# Patient Record
Sex: Female | Born: 1955 | Race: White | Hispanic: No | Marital: Married | Smoking: Former smoker
Health system: Southern US, Community
[De-identification: ages and names within clinical notes are randomized; demographics above are authoritative.]

## PROBLEM LIST (undated history)

## (undated) DIAGNOSIS — I10 Essential (primary) hypertension: Secondary | ICD-10-CM

## (undated) DIAGNOSIS — E039 Hypothyroidism, unspecified: Secondary | ICD-10-CM

## (undated) DIAGNOSIS — M1990 Unspecified osteoarthritis, unspecified site: Secondary | ICD-10-CM

## (undated) DIAGNOSIS — K219 Gastro-esophageal reflux disease without esophagitis: Secondary | ICD-10-CM

## (undated) HISTORY — DX: Hypothyroidism, unspecified: E03.9

## (undated) HISTORY — DX: Unspecified osteoarthritis, unspecified site: M19.90

## (undated) HISTORY — DX: Gastro-esophageal reflux disease without esophagitis: K21.9

## (undated) HISTORY — DX: Essential (primary) hypertension: I10

## (undated) HISTORY — PX: COLONOSCOPY: SHX174

---

## 1991-04-22 HISTORY — PX: TUBAL LIGATION: SHX77

## 2000-06-19 ENCOUNTER — Encounter: Payer: Self-pay | Admitting: Family Medicine

## 2000-06-19 ENCOUNTER — Encounter: Admission: RE | Admit: 2000-06-19 | Discharge: 2000-06-19 | Payer: Self-pay | Admitting: Family Medicine

## 2001-07-02 ENCOUNTER — Encounter: Payer: Self-pay | Admitting: Family Medicine

## 2001-07-02 ENCOUNTER — Encounter: Admission: RE | Admit: 2001-07-02 | Discharge: 2001-07-02 | Payer: Self-pay | Admitting: Family Medicine

## 2003-01-12 ENCOUNTER — Encounter: Admission: RE | Admit: 2003-01-12 | Discharge: 2003-01-12 | Payer: Self-pay | Admitting: Family Medicine

## 2003-01-12 ENCOUNTER — Encounter: Payer: Self-pay | Admitting: Family Medicine

## 2004-02-07 ENCOUNTER — Encounter: Admission: RE | Admit: 2004-02-07 | Discharge: 2004-02-07 | Payer: Self-pay | Admitting: Family Medicine

## 2005-02-11 ENCOUNTER — Encounter: Admission: RE | Admit: 2005-02-11 | Discharge: 2005-02-11 | Payer: Self-pay | Admitting: Family Medicine

## 2006-02-24 ENCOUNTER — Encounter: Admission: RE | Admit: 2006-02-24 | Discharge: 2006-02-24 | Payer: Self-pay | Admitting: Family Medicine

## 2007-03-24 ENCOUNTER — Encounter: Admission: RE | Admit: 2007-03-24 | Discharge: 2007-03-24 | Payer: Self-pay | Admitting: Family Medicine

## 2008-04-06 ENCOUNTER — Encounter: Admission: RE | Admit: 2008-04-06 | Discharge: 2008-04-06 | Payer: Self-pay | Admitting: Family Medicine

## 2009-06-05 ENCOUNTER — Encounter: Admission: RE | Admit: 2009-06-05 | Discharge: 2009-06-05 | Payer: Self-pay | Admitting: Family Medicine

## 2011-12-31 ENCOUNTER — Other Ambulatory Visit: Payer: Self-pay | Admitting: Family Medicine

## 2011-12-31 DIAGNOSIS — Z1231 Encounter for screening mammogram for malignant neoplasm of breast: Secondary | ICD-10-CM

## 2012-01-22 ENCOUNTER — Ambulatory Visit
Admission: RE | Admit: 2012-01-22 | Discharge: 2012-01-22 | Disposition: A | Discharge status: Home / Self Care | Payer: PRIVATE HEALTH INSURANCE | Source: Ambulatory Visit | Attending: Family Medicine | Admitting: Family Medicine

## 2012-01-22 DIAGNOSIS — Z1231 Encounter for screening mammogram for malignant neoplasm of breast: Secondary | ICD-10-CM

## 2012-10-20 ENCOUNTER — Encounter: Payer: Self-pay | Admitting: Internal Medicine

## 2012-11-26 ENCOUNTER — Encounter: Payer: PRIVATE HEALTH INSURANCE | Admitting: Internal Medicine

## 2013-02-14 ENCOUNTER — Encounter: Payer: Self-pay | Admitting: Internal Medicine

## 2013-04-25 ENCOUNTER — Ambulatory Visit (AMBULATORY_SURGERY_CENTER): Payer: Self-pay | Admitting: *Deleted

## 2013-04-25 VITALS — Ht 64.0 in | Wt 210.4 lb

## 2013-04-25 DIAGNOSIS — Z1211 Encounter for screening for malignant neoplasm of colon: Secondary | ICD-10-CM

## 2013-04-25 MED ORDER — NA SULFATE-K SULFATE-MG SULF 17.5-3.13-1.6 GM/177ML PO SOLN: 1.0000 | Freq: Once | ORAL | Status: DC

## 2013-04-25 NOTE — Progress Notes (Signed)
Denies allergies to eggs or soy products. Denies complications with sedation or anesthesia. 

## 2013-05-09 ENCOUNTER — Ambulatory Visit (AMBULATORY_SURGERY_CENTER): Payer: PRIVATE HEALTH INSURANCE | Admitting: Internal Medicine

## 2013-05-09 ENCOUNTER — Encounter: Payer: Self-pay | Admitting: Internal Medicine

## 2013-05-09 VITALS — BP 169/87 | HR 62 | Temp 97.6°F | Resp 37 | Ht 64.0 in | Wt 210.0 lb

## 2013-05-09 DIAGNOSIS — K5730 Diverticulosis of large intestine without perforation or abscess without bleeding: Secondary | ICD-10-CM

## 2013-05-09 DIAGNOSIS — Z1211 Encounter for screening for malignant neoplasm of colon: Secondary | ICD-10-CM

## 2013-05-09 MED ORDER — SODIUM CHLORIDE 0.9 % IV SOLN: 500.0000 mL | INTRAVENOUS | Status: DC

## 2013-05-09 NOTE — Patient Instructions (Addendum)
No polyps seen today. You do have a condition called diverticulosis - common and not usually a problem. Please read the handout provided.  I will let you know pathology results and when to have another routine colonoscopy by mail.  I appreciate the opportunity to care for you. Iva Booparl E. Gessner, MD, [facility]   Per Dr. Leone PayorGessner handouts were given to your care partner on diverticulosis and a high fiber diet with liberal amount of fluids. You may resume your current medications today. Please call if any questions or concerns.    YOU HAD AN ENDOSCOPIC PROCEDURE TODAY AT THE [facility]: Refer to the procedure report that was given to you for any specific questions about what was found during the examination.  If the procedure report does not answer your questions, please call your gastroenterologist to clarify.  If you requested that your care partner not be given the details of your procedure findings, then the procedure report has been included in a sealed envelope for you to review at your convenience later.  YOU SHOULD EXPECT: Some feelings of bloating in the abdomen. Passage of more gas than usual.  Walking can help get rid of the air that was put into your GI tract during the procedure and reduce the bloating. If you had a lower endoscopy (such as a colonoscopy or flexible sigmoidoscopy) you may notice spotting of blood in your stool or on the toilet paper. If you underwent a bowel prep for your procedure, then you may not have a normal bowel movement for a few days.  DIET: Your first meal following the procedure should be a light meal and then it is ok to progress to your normal diet.  A half-sandwich or bowl of soup is an example of a good first meal.  Heavy or fried foods are harder to digest and may make you feel nauseous or bloated.  Likewise meals heavy in dairy and vegetables can cause extra gas to form and this can also increase the bloating.  Drink plenty of fluids but you should  avoid alcoholic beverages for 24 hours.  ACTIVITY: Your care partner should take you home directly after the procedure.  You should plan to take it easy, moving slowly for the rest of the day.  You can resume normal activity the day after the procedure however you should NOT DRIVE or use heavy machinery for 24 hours (because of the sedation medicines used during the test).    SYMPTOMS TO REPORT IMMEDIATELY: A gastroenterologist can be reached at any hour.  During normal business hours, 8:30 AM to 5:00 PM Monday through Friday, call [phone].  After hours and on weekends, please call the GI answering service at [phone] who will take a message and have the physician on call contact you.   Following lower endoscopy (colonoscopy or flexible sigmoidoscopy):  Excessive amounts of blood in the stool  Significant tenderness or worsening of abdominal pains  Swelling of the abdomen that is new, acute  Fever of 100F or higher   FOLLOW UP: If any biopsies were taken you will be contacted by phone or by letter within the next 1-3 weeks.  Call your gastroenterologist if you have not heard about the biopsies in 3 weeks.  Our staff will call the home number listed on your records the next business day following your procedure to check on you and address any questions or concerns that you may have at that time regarding the information given to you  following your procedure. This is a courtesy call and so if there is no answer at the home number and we have not heard from you through the emergency physician on call, we will assume that you have returned to your regular daily activities without incident.  SIGNATURES/CONFIDENTIALITY: You and/or your care partner have signed paperwork which will be entered into your electronic medical record.  These signatures attest to the fact that that the information above on your After Visit Summary has been reviewed and is understood.  Full responsibility of the  confidentiality of this discharge information lies with you and/or your care-partner.

## 2013-05-09 NOTE — Progress Notes (Signed)
No complaints noted in the recovery room. Maw   

## 2013-05-09 NOTE — Progress Notes (Signed)
A/ox3 pleased with MAC, report to Annette RN 

## 2013-05-09 NOTE — Op Note (Signed)
[facility] [address] [place], [id number]   COLONOSCOPY PROCEDURE REPORT  PATIENT: Virginia SaltsLong, [name]  MR#: [id number] BIRTHDATE: [birth year] , 57  yrs. old GENDER: Female ENDOSCOPIST: Iva Booparl E Gessner, MD, [facility] REFERRED XB:JYNWGNBY:Teresa Dareen PianoAnderson, F.N.P.-B.C. PROCEDURE DATE:  05/09/2013 PROCEDURE:   Colonoscopy, screening First Screening Colonoscopy - Avg.  risk and is 50 yrs.  old or older Yes.  Prior Negative Screening - Now for repeat screening. N/A  History of Adenoma - Now for follow-up colonoscopy & has been > or = to 3 yrs.  N/A  Polyps Removed Today? No.  Recommend repeat exam, <10 yrs? No. ASA CLASS:   Class II INDICATIONS:average risk screening and first colonoscopy. MEDICATIONS: propofol (Diprivan) 150mg  IV, MAC sedation, administered by CRNA, and These medications were titrated to patient response per physician's verbal order  DESCRIPTION OF PROCEDURE:   After the risks benefits and alternatives of the procedure were thoroughly explained, informed consent was obtained.  A digital rectal exam revealed no abnormalities of the rectum.   The LB PFC-H190 [id number]  endoscope was introduced through the anus and advanced to the cecum, which was identified by both the appendix and ileocecal valve. No adverse events experienced.   The quality of the prep was excellent using Suprep  The instrument was then slowly withdrawn as the colon was fully examined.   COLON FINDINGS: Mild diverticulosis was noted in the descending colon.   The colon mucosa was otherwise normal.   A right colon retroflexion was performed.  Retroflexed views revealed no abnormalities. The time to cecum=2 minutes 11 seconds.  Withdrawal time=8 minutes 12 seconds.  The scope was withdrawn and the procedure completed. COMPLICATIONS: There were no complications.  ENDOSCOPIC IMPRESSION: 1.   Mild diverticulosis was noted in the descending colon 2.   The colon mucosa was otherwise normal - excellent  prep - first colonoscopy  RECOMMENDATIONS: Repeat colonoscopy 10 years - 2025   eSigned:  Iva Booparl E Gessner, MD, [facility] 05/09/2013 11:06 AM   cc: Elizabeth Palaueresa Anderson, F.N.P.-B.C and The Patient

## 2013-05-10 ENCOUNTER — Telehealth: Payer: Self-pay | Admitting: *Deleted

## 2013-05-10 NOTE — Telephone Encounter (Signed)
  Follow up Call-  Call back number 05/09/2013  Post procedure Call Back phone  # [phone]  Permission to leave phone message Yes     Patient questions:  Do you have a fever, pain , or abdominal swelling? no Pain Score  0 *  Have you tolerated food without any problems? yes  Have you been able to return to your normal activities? yes  Do you have any questions about your discharge instructions: Diet   no Medications  no Follow up visit  no  Do you have questions or concerns about your Care? no  Actions: * If pain score is 4 or above: No action needed, pain <4.

## 2014-01-26 ENCOUNTER — Other Ambulatory Visit: Payer: Self-pay | Admitting: Nurse Practitioner

## 2014-01-26 DIAGNOSIS — Z1239 Encounter for other screening for malignant neoplasm of breast: Secondary | ICD-10-CM

## 2014-04-19 ENCOUNTER — Ambulatory Visit
Admission: RE | Admit: 2014-04-19 | Discharge: 2014-04-19 | Disposition: A | Discharge status: Home / Self Care | Payer: PRIVATE HEALTH INSURANCE | Source: Ambulatory Visit | Attending: Nurse Practitioner | Admitting: Nurse Practitioner

## 2014-04-19 DIAGNOSIS — Z1239 Encounter for other screening for malignant neoplasm of breast: Secondary | ICD-10-CM

## 2014-04-25 ENCOUNTER — Other Ambulatory Visit: Payer: Self-pay | Admitting: Nurse Practitioner

## 2014-04-25 DIAGNOSIS — R928 Other abnormal and inconclusive findings on diagnostic imaging of breast: Secondary | ICD-10-CM

## 2014-05-04 ENCOUNTER — Ambulatory Visit
Admission: RE | Admit: 2014-05-04 | Discharge: 2014-05-04 | Disposition: A | Discharge status: Home / Self Care | Payer: PRIVATE HEALTH INSURANCE | Source: Ambulatory Visit | Attending: Nurse Practitioner | Admitting: Nurse Practitioner

## 2014-05-04 DIAGNOSIS — R928 Other abnormal and inconclusive findings on diagnostic imaging of breast: Secondary | ICD-10-CM

## 2014-05-08 ENCOUNTER — Other Ambulatory Visit: Payer: PRIVATE HEALTH INSURANCE

## 2014-09-26 ENCOUNTER — Other Ambulatory Visit: Payer: Self-pay | Admitting: Nurse Practitioner

## 2014-09-26 DIAGNOSIS — N6310 Unspecified lump in the right breast, unspecified quadrant: Secondary | ICD-10-CM

## 2014-11-06 ENCOUNTER — Ambulatory Visit
Admission: RE | Admit: 2014-11-06 | Discharge: 2014-11-06 | Disposition: A | Discharge status: Home / Self Care | Payer: PRIVATE HEALTH INSURANCE | Source: Ambulatory Visit | Attending: Nurse Practitioner | Admitting: Nurse Practitioner

## 2014-11-06 DIAGNOSIS — N6310 Unspecified lump in the right breast, unspecified quadrant: Secondary | ICD-10-CM

## 2015-07-31 ENCOUNTER — Other Ambulatory Visit: Payer: Self-pay

## 2015-07-31 DIAGNOSIS — Z1231 Encounter for screening mammogram for malignant neoplasm of breast: Secondary | ICD-10-CM

## 2015-08-21 ENCOUNTER — Ambulatory Visit
Admission: RE | Admit: 2015-08-21 | Discharge: 2015-08-21 | Disposition: A | Discharge status: Home / Self Care | Payer: PRIVATE HEALTH INSURANCE | Source: Ambulatory Visit

## 2015-08-21 DIAGNOSIS — Z1231 Encounter for screening mammogram for malignant neoplasm of breast: Secondary | ICD-10-CM

## 2016-08-06 ENCOUNTER — Other Ambulatory Visit: Payer: Self-pay | Admitting: Nurse Practitioner

## 2016-08-06 DIAGNOSIS — Z1231 Encounter for screening mammogram for malignant neoplasm of breast: Secondary | ICD-10-CM

## 2016-09-01 ENCOUNTER — Ambulatory Visit
Admission: RE | Admit: 2016-09-01 | Discharge: 2016-09-01 | Disposition: A | Discharge status: Home / Self Care | Payer: PRIVATE HEALTH INSURANCE | Source: Ambulatory Visit | Attending: Nurse Practitioner | Admitting: Nurse Practitioner

## 2016-09-01 DIAGNOSIS — Z1231 Encounter for screening mammogram for malignant neoplasm of breast: Secondary | ICD-10-CM

## 2017-09-03 ENCOUNTER — Other Ambulatory Visit: Payer: Self-pay | Admitting: Nurse Practitioner

## 2017-09-03 DIAGNOSIS — Z1231 Encounter for screening mammogram for malignant neoplasm of breast: Secondary | ICD-10-CM

## 2017-12-08 ENCOUNTER — Ambulatory Visit: Payer: PRIVATE HEALTH INSURANCE

## 2018-03-17 ENCOUNTER — Ambulatory Visit
Admission: RE | Admit: 2018-03-17 | Discharge: 2018-03-17 | Disposition: A | Discharge status: Home / Self Care | Payer: PRIVATE HEALTH INSURANCE | Source: Ambulatory Visit | Attending: Nurse Practitioner | Admitting: Nurse Practitioner

## 2018-03-17 DIAGNOSIS — Z1231 Encounter for screening mammogram for malignant neoplasm of breast: Secondary | ICD-10-CM

## 2019-02-07 ENCOUNTER — Other Ambulatory Visit: Payer: Self-pay | Admitting: Nurse Practitioner

## 2019-02-07 DIAGNOSIS — Z1231 Encounter for screening mammogram for malignant neoplasm of breast: Secondary | ICD-10-CM

## 2019-03-24 ENCOUNTER — Ambulatory Visit: Payer: PRIVATE HEALTH INSURANCE

## 2019-05-13 ENCOUNTER — Ambulatory Visit: Payer: PRIVATE HEALTH INSURANCE

## 2019-09-26 ENCOUNTER — Other Ambulatory Visit: Payer: Self-pay

## 2019-09-26 ENCOUNTER — Ambulatory Visit
Admission: RE | Admit: 2019-09-26 | Discharge: 2019-09-26 | Disposition: A | Discharge status: Home / Self Care | Payer: Managed Care, Other (non HMO) | Source: Ambulatory Visit | Attending: Nurse Practitioner | Admitting: Nurse Practitioner

## 2019-09-26 DIAGNOSIS — Z1231 Encounter for screening mammogram for malignant neoplasm of breast: Secondary | ICD-10-CM

## 2019-09-28 ENCOUNTER — Ambulatory Visit: Payer: Managed Care, Other (non HMO)

## 2020-08-30 ENCOUNTER — Other Ambulatory Visit: Payer: Self-pay | Admitting: Nurse Practitioner

## 2020-08-30 DIAGNOSIS — Z1231 Encounter for screening mammogram for malignant neoplasm of breast: Secondary | ICD-10-CM

## 2020-11-21 ENCOUNTER — Other Ambulatory Visit: Payer: Self-pay

## 2020-11-21 ENCOUNTER — Ambulatory Visit
Admission: RE | Admit: 2020-11-21 | Discharge: 2020-11-21 | Disposition: A | Discharge status: Home / Self Care | Payer: BC Managed Care – PPO | Source: Ambulatory Visit | Attending: Nurse Practitioner | Admitting: Nurse Practitioner

## 2020-11-21 DIAGNOSIS — Z1231 Encounter for screening mammogram for malignant neoplasm of breast: Secondary | ICD-10-CM

## 2021-12-18 ENCOUNTER — Other Ambulatory Visit: Payer: Self-pay | Admitting: Nurse Practitioner

## 2021-12-18 DIAGNOSIS — Z1231 Encounter for screening mammogram for malignant neoplasm of breast: Secondary | ICD-10-CM

## 2021-12-27 ENCOUNTER — Ambulatory Visit: Payer: BC Managed Care – PPO

## 2022-01-03 ENCOUNTER — Ambulatory Visit
Admission: RE | Admit: 2022-01-03 | Discharge: 2022-01-03 | Disposition: A | Discharge status: Home / Self Care | Payer: BC Managed Care – PPO | Source: Ambulatory Visit | Attending: Nurse Practitioner | Admitting: Nurse Practitioner

## 2022-01-03 DIAGNOSIS — Z1231 Encounter for screening mammogram for malignant neoplasm of breast: Secondary | ICD-10-CM

## 2022-12-30 ENCOUNTER — Ambulatory Visit: Payer: BC Managed Care – PPO | Admitting: Dermatology

## 2023-01-08 ENCOUNTER — Ambulatory Visit (INDEPENDENT_AMBULATORY_CARE_PROVIDER_SITE_OTHER): Payer: Medicare Other | Admitting: Dermatology

## 2023-01-08 ENCOUNTER — Encounter: Payer: Self-pay | Admitting: Dermatology

## 2023-01-08 VITALS — BP 126/76 | HR 79

## 2023-01-08 DIAGNOSIS — L814 Other melanin hyperpigmentation: Secondary | ICD-10-CM

## 2023-01-08 DIAGNOSIS — W908XXA Exposure to other nonionizing radiation, initial encounter: Secondary | ICD-10-CM

## 2023-01-08 DIAGNOSIS — D2339 Other benign neoplasm of skin of other parts of face: Secondary | ICD-10-CM | POA: Diagnosis not present

## 2023-01-08 DIAGNOSIS — D1801 Hemangioma of skin and subcutaneous tissue: Secondary | ICD-10-CM

## 2023-01-08 DIAGNOSIS — L570 Actinic keratosis: Secondary | ICD-10-CM

## 2023-01-08 DIAGNOSIS — Z1283 Encounter for screening for malignant neoplasm of skin: Secondary | ICD-10-CM | POA: Diagnosis not present

## 2023-01-08 DIAGNOSIS — D485 Neoplasm of uncertain behavior of skin: Secondary | ICD-10-CM

## 2023-01-08 DIAGNOSIS — D040 Carcinoma in situ of skin of lip: Secondary | ICD-10-CM

## 2023-01-08 DIAGNOSIS — L821 Other seborrheic keratosis: Secondary | ICD-10-CM

## 2023-01-08 DIAGNOSIS — L409 Psoriasis, unspecified: Secondary | ICD-10-CM

## 2023-01-08 DIAGNOSIS — L578 Other skin changes due to chronic exposure to nonionizing radiation: Secondary | ICD-10-CM

## 2023-01-08 DIAGNOSIS — C4492 Squamous cell carcinoma of skin, unspecified: Secondary | ICD-10-CM

## 2023-01-08 HISTORY — DX: Squamous cell carcinoma of skin, unspecified: C44.92

## 2023-01-08 MED ORDER — CLOBETASOL PROPIONATE 0.05 % EX SOLN: CUTANEOUS | 1 refills | Status: AC

## 2023-01-08 MED ORDER — CLOBETASOL PROPIONATE 0.05 % EX OINT: TOPICAL_OINTMENT | CUTANEOUS | 1 refills | Status: DC

## 2023-01-08 NOTE — Patient Instructions (Addendum)

## 2023-01-08 NOTE — Progress Notes (Signed)
New Patient Visit   Subjective  Virginia Yu is a 67 y.o. female who presents for the following: Skin Cancer Screening and Full Body Skin Exam  The patient presents for Total-Body Skin Exam (TBSE) for skin cancer screening and mole check. The patient has spots, moles and lesions to be evaluated, some may be new or changing and the patient may have concern these could be cancer. Pt's last skin check has been a few years No hx of skin cancer but has had precancers. Possible family hx of NMSC  The following portions of the chart were reviewed this encounter and updated as appropriate: medications, allergies, medical history  Review of Systems:  No other skin or systemic complaints except as noted in HPI or Assessment and Plan.  Objective  Well appearing patient in no apparent distress; mood and affect are within normal limits.  A full examination was performed including scalp, head, eyes, ears, nose, lips, neck, chest, axillae, abdomen, back, buttocks, bilateral upper extremities, bilateral lower extremities, hands, feet, fingers, toes, fingernails, and toenails. All findings within normal limits unless otherwise noted below.   Relevant physical exam findings are noted in the Assessment and Plan.  left zygoma Irregular brown macule       Left Upper Cutaneous Lip Cutaneous horn         Assessment & Plan   SKIN CANCER SCREENING PERFORMED TODAY.  ACTINIC DAMAGE - Chronic condition, secondary to cumulative UV/sun exposure - diffuse scaly erythematous macules with underlying dyspigmentation - Recommend daily broad spectrum sunscreen SPF 30+ to sun-exposed areas, reapply every 2 hours as needed.  - Staying in the shade or wearing Cavitt sleeves, sun glasses (UVA+UVB protection) and wide brim hats (4-inch brim around the entire circumference of the hat) are also recommended for sun protection.  - Call for new or changing lesions.  MELANOCYTIC NEVI - Tan-brown and/or  pink-flesh-colored symmetric macules and papules - Benign appearing on exam today - Observation - Call clinic for new or changing moles - Recommend daily use of broad spectrum spf 30+ sunscreen to sun-exposed areas.   SEBORRHEIC KERATOSIS - Stuck-on, waxy, tan-brown papules and/or plaques  - Benign-appearing - Discussed benign etiology and prognosis. - Observe - Call for any changes  LENTIGINES Exam: scattered tan macules Due to sun exposure Treatment Plan: Benign-appearing, observe. Recommend daily broad spectrum sunscreen SPF 30+ to sun-exposed areas, reapply every 2 hours as needed.  Call for any changes    HEMANGIOMA Exam: red papule(s) Discussed benign nature. Recommend observation. Call for changes.   PSORIASIS Exam: Well-demarcated erythematous papules/plaques with silvery scale, guttate pink scaly papules.on left lower leg, and scalp  Chronic and persistent condition with duration or expected duration over one year. Condition is bothersome/symptomatic for patient. Currently flared.- Bilateral lower legs and scalp BSA < 5%  Psoriasis is a chronic non-curable, but treatable genetic/hereditary disease that may have other systemic features affecting other organ systems such as joints (Psoriatic Arthritis). It is associated with an increased risk of inflammatory bowel disease, heart disease, non-alcoholic fatty liver disease, and depression.  Treatments include light and laser treatments; topical medications; and systemic medications including oral and injectables.  Treatment Plan: - Clobetasol ointment BID to affected areas PRN - Clobetasol solution BID to affected areas on Scalp   Neoplasm of uncertain behavior of skin (2) left zygoma  Skin / nail biopsy Type of biopsy: tangential   Instrument used: DermaBlade   Post-procedure details: wound care instructions given    Specimen 1 -  Surgical pathology Differential Diagnosis: R/O LM vs lentigo vs SK  Check Margins:  No  Left Upper Cutaneous Lip  Skin / nail biopsy Type of biopsy: tangential   Instrument used: DermaBlade   Post-procedure details: wound care instructions given    Specimen 2 - Surgical pathology Differential Diagnosis: R/O HAK vs SCCIS  Check Margins: No  AK (actinic keratosis) (2) Left Forearm - Posterior  Destruction of lesion - Left Forearm - Posterior (2)  Destruction method: cryotherapy   Timeout:  patient name, date of birth, surgical site, and procedure verified Lesion destroyed using liquid nitrogen: Yes   Region frozen until ice ball extended beyond lesion: Yes      Return if symptoms worsen or fail to improve.  I, Tillie Fantasia, CMA, am acting as scribe for Gwenith Daily, MD.   Documentation: I have reviewed the above documentation for accuracy and completeness, and I agree with the above.  Gwenith Daily, MD

## 2023-01-09 LAB — SURGICAL PATHOLOGY

## 2023-01-15 ENCOUNTER — Telehealth: Payer: Self-pay

## 2023-01-15 NOTE — Telephone Encounter (Signed)
Advised patient of results and scheduled for LN2 treatments/hd

## 2023-01-15 NOTE — Telephone Encounter (Signed)
-----   Message from Langston Reusing sent at 01/12/2023  3:25 PM EDT ----- Please call patient and let her know the 2 bx's were abnormal and need to be treated with LN2 in office.  She can be scheduled in a regular appointment slot.  Thanks!  1. Skin , left zygoma LARGE CELL ACANTHOMA 2. 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

## 2023-02-12 ENCOUNTER — Ambulatory Visit: Payer: Medicare Other | Admitting: Dermatology

## 2023-03-17 ENCOUNTER — Ambulatory Visit: Payer: Medicare Other | Admitting: Dermatology

## 2023-03-30 ENCOUNTER — Encounter: Payer: Self-pay | Admitting: Nurse Practitioner

## 2023-03-30 ENCOUNTER — Other Ambulatory Visit: Payer: Self-pay | Admitting: Adult Health Nurse Practitioner

## 2023-03-30 ENCOUNTER — Encounter: Payer: Self-pay | Admitting: Dermatology

## 2023-03-30 DIAGNOSIS — Z0000 Encounter for general adult medical examination without abnormal findings: Secondary | ICD-10-CM

## 2023-03-31 IMAGING — MG MM DIGITAL SCREENING BILAT W/ TOMO AND CAD
8 series · 8 of 24 positions shown · non-contrast
Comparison: Previous exam(s).

CLINICAL DATA: Screening.

EXAM:
DIGITAL SCREENING BILATERAL MAMMOGRAM WITH TOMOSYNTHESIS AND CAD
TECHNIQUE: Bilateral screening digital craniocaudal and mediolateral oblique
mammograms were obtained. Bilateral screening digital breast
tomosynthesis was performed. The images were evaluated with
computer-aided detection.

[L CC synth-2D]
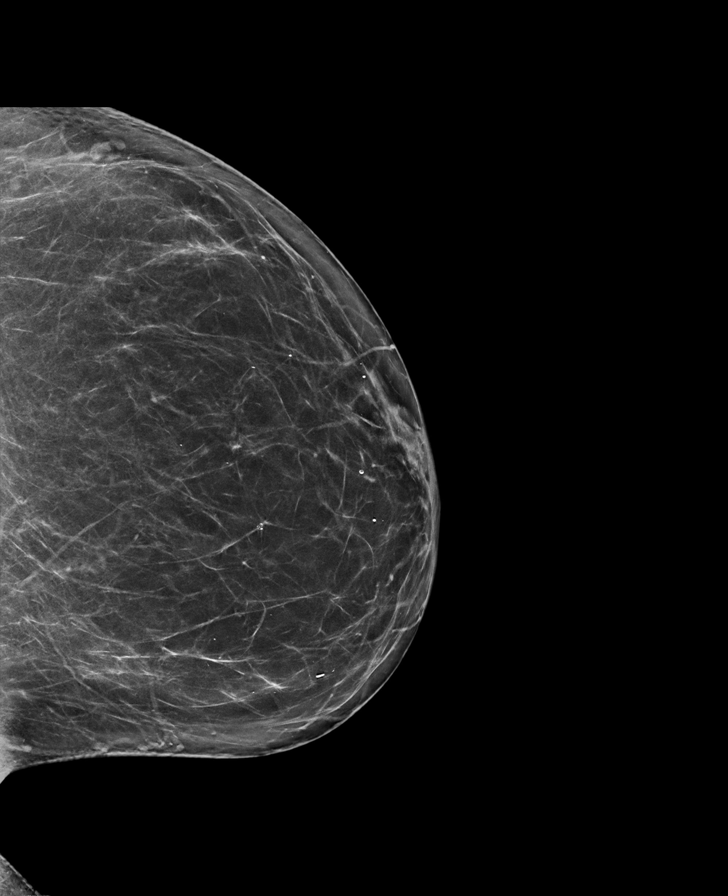

[R CC synth-2D]
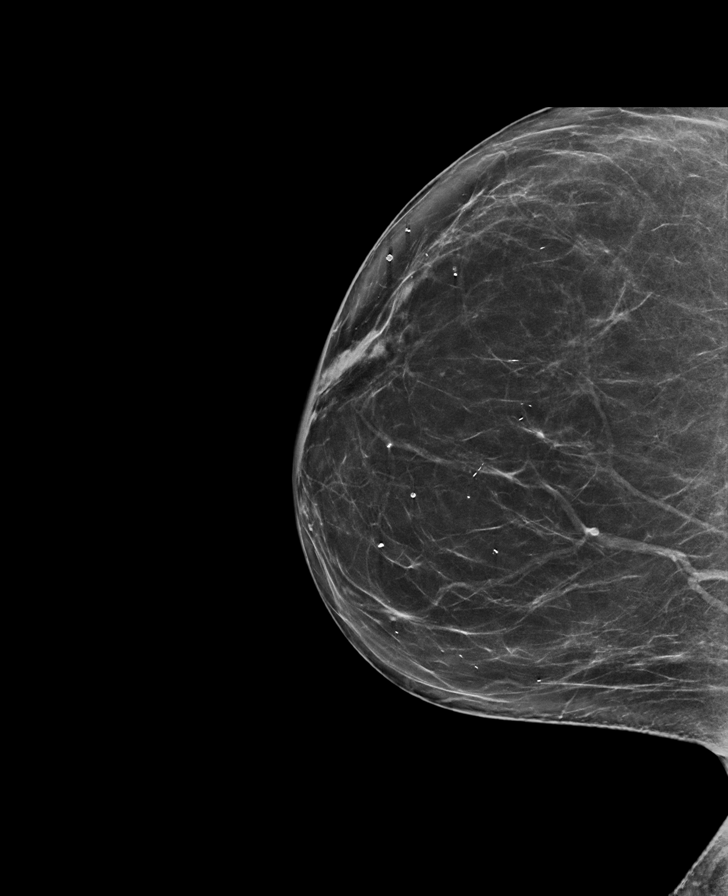

[L MLO synth-2D]
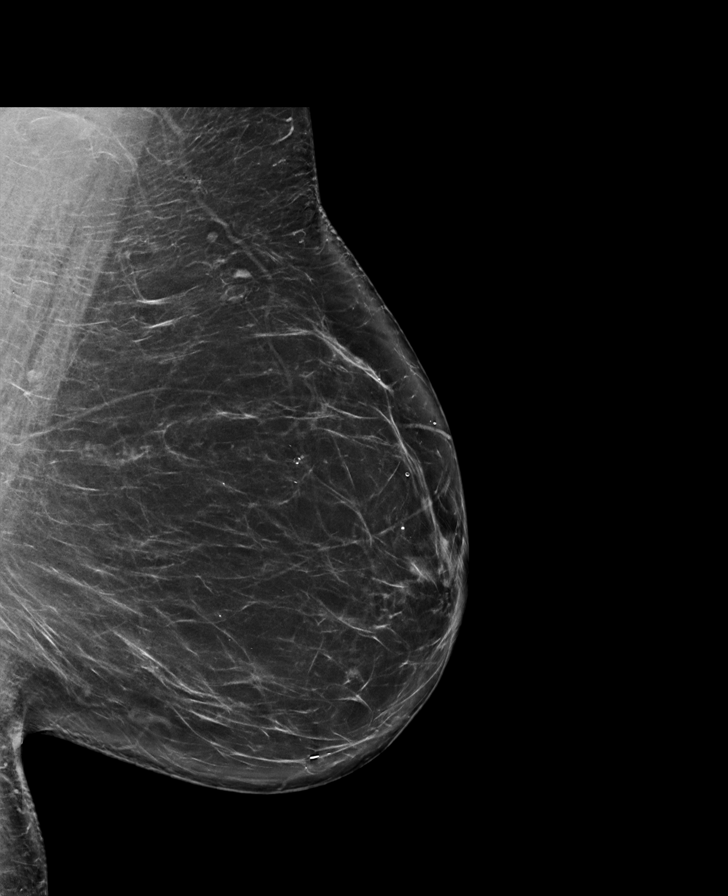

[R MLO synth-2D]
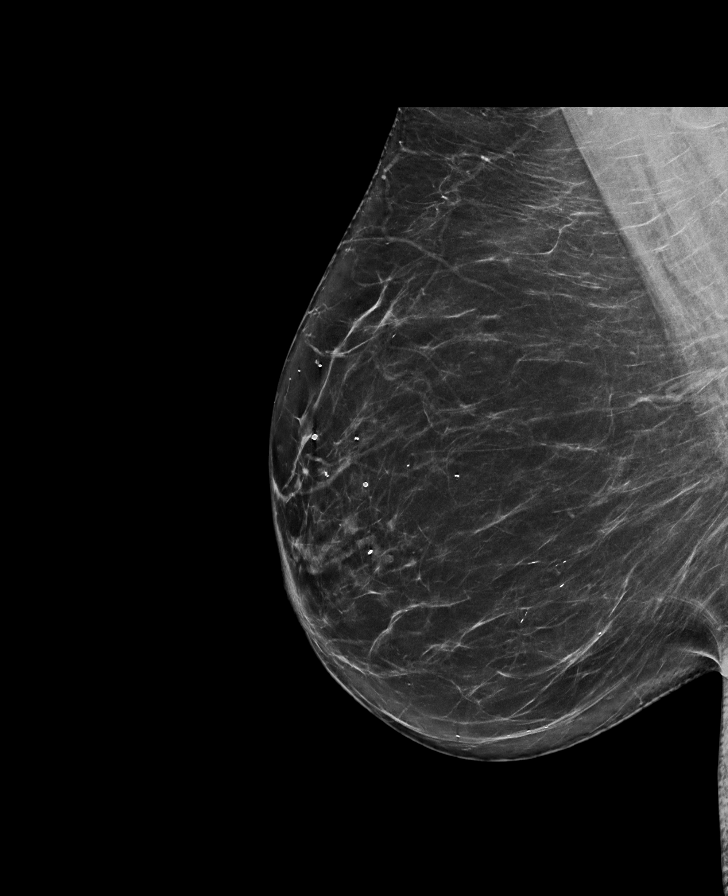

[L CC tomo · tomo slice 38/75.0]
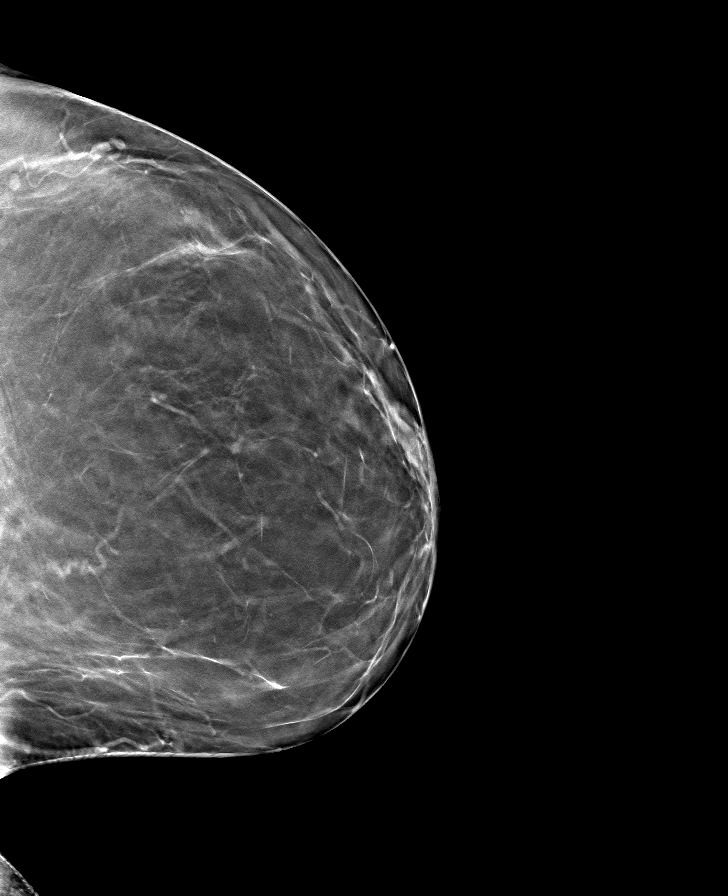

[L MLO tomo · tomo slice 45/88.0]
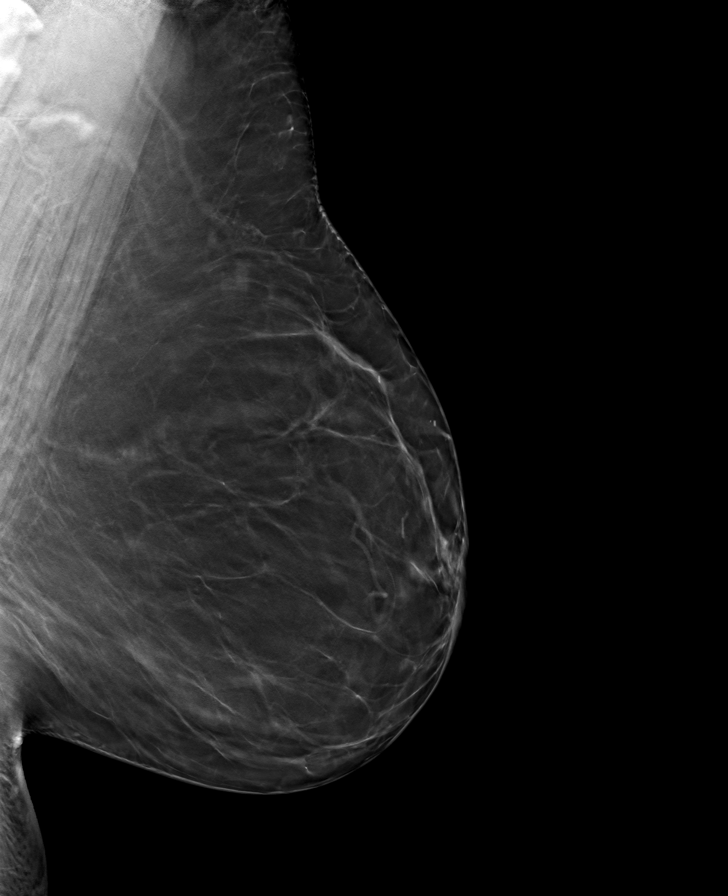

[R CC tomo · tomo slice 37/73.0]
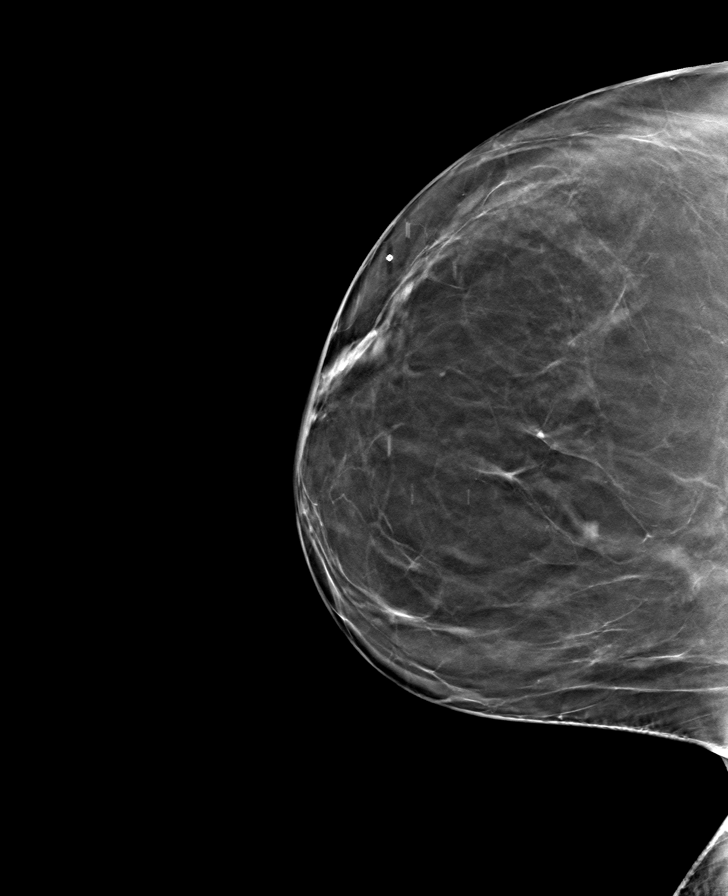

[R MLO tomo · tomo slice 43/85.0]
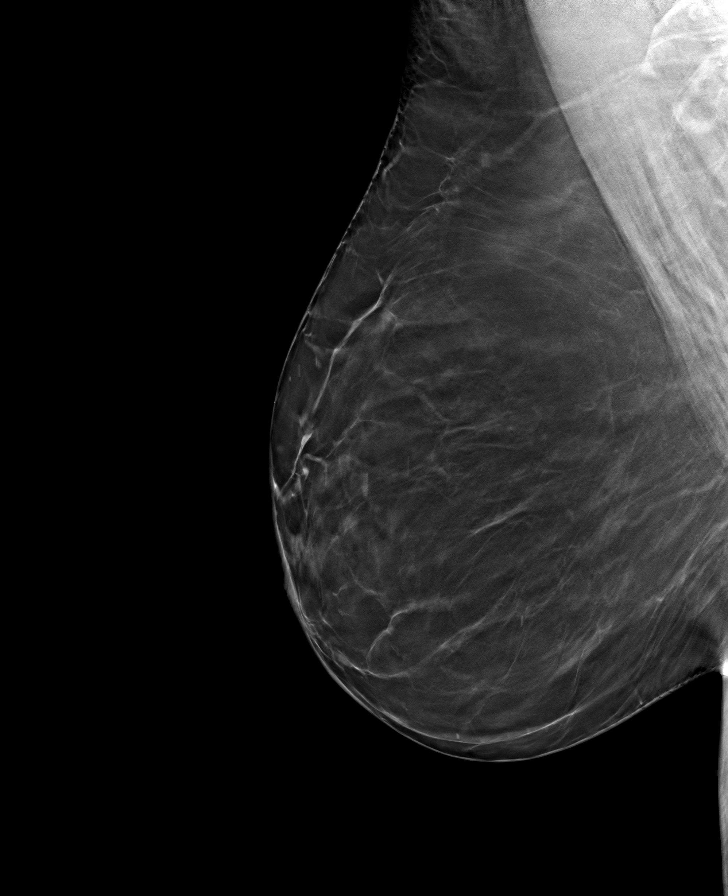

[8 of 24 positions shown; findings below may reference images not displayed]

ACR Breast Density Category b: There are scattered areas of
fibroglandular density.
FINDINGS: There are no findings suspicious for malignancy.
IMPRESSION: No mammographic evidence of malignancy. A result letter of this
screening mammogram will be mailed directly to the patient.

RECOMMENDATION:
Screening mammogram in one year. (Code:51-O-LD2)

BI-RADS CATEGORY  1: Negative.

## 2023-04-02 ENCOUNTER — Encounter: Payer: Self-pay | Admitting: Dermatology

## 2023-04-02 ENCOUNTER — Ambulatory Visit (INDEPENDENT_AMBULATORY_CARE_PROVIDER_SITE_OTHER): Payer: Medicare Other | Admitting: Dermatology

## 2023-04-02 VITALS — BP 112/68 | HR 78 | Temp 97.8°F

## 2023-04-02 DIAGNOSIS — L814 Other melanin hyperpigmentation: Secondary | ICD-10-CM | POA: Diagnosis not present

## 2023-04-02 DIAGNOSIS — L579 Skin changes due to chronic exposure to nonionizing radiation, unspecified: Secondary | ICD-10-CM | POA: Diagnosis not present

## 2023-04-02 DIAGNOSIS — C4492 Squamous cell carcinoma of skin, unspecified: Secondary | ICD-10-CM

## 2023-04-02 DIAGNOSIS — W908XXA Exposure to other nonionizing radiation, initial encounter: Secondary | ICD-10-CM | POA: Diagnosis not present

## 2023-04-02 DIAGNOSIS — D040 Carcinoma in situ of skin of lip: Secondary | ICD-10-CM

## 2023-04-02 DIAGNOSIS — L570 Actinic keratosis: Secondary | ICD-10-CM

## 2023-04-02 MED ORDER — MUPIROCIN 2 % EX OINT: 1.0000 | TOPICAL_OINTMENT | Freq: Every day | CUTANEOUS | 0 refills | Status: AC

## 2023-04-02 MED ORDER — OXYCODONE HCL 5 MG PO CAPS: 5.0000 mg | ORAL_CAPSULE | ORAL | 0 refills | Status: DC | PRN

## 2023-04-02 NOTE — Patient Instructions (Signed)
Wound Care Instructions for After Surgery  On the day following your surgery, you should begin doing daily dressing changes until your sutures are removed: Remove the bandage. Cleanse the wound gently with soap and water.  Make sure you then dry the skin surrounding the wound completely or the tape will not stick to the skin. Do not use cotton balls on the wound. After the wound is clean and dry, apply the ointment (either prescription antibiotic prescribed by your doctor or plain Vaseline if nothing was prescribed) gently with a Q-tip. If you are using a bandaid to cover: Apply a bandaid large enough to cover the entire wound. If you do not have a bandaid large enough to cover the wound OR if you are sensitive to bandaid adhesive: Cut a non-stick pad (such as Telfa) to fit the size of the wound.  Cover the wound with the non-stick pad. If the wound is draining, you may want to add a small amount of gauze on top of the non-stick pad for a little added compression to the area. Use tape to seal the area completely.  For the next 1-2 weeks: Be sure to keep the wound moist with ointment 24/7 to ensure best healing. If you are unable to cover the wound with a bandage to hold the ointment in place, you may need to reapply the ointment several times a day. Do not bend over or lift heavy items to reduce the chance of elevated blood pressure to the wound. Do not participate in particularly strenuous activities.  Below is a list of dressing supplies you might need.  Cotton-tipped applicators - Q-tips Gauze pads (2x2 and/or 4x4) - All-Purpose Sponges New and clean tube of petroleum jelly (Vaseline) OR prescription antibiotic ointment if prescribed Either a bandaid large enough to cover the entire wound OR non-stick dressing material (Telfa) and Tape (Paper or Hypafix)  FOR ADULT SURGERY PATIENTS: If you need something for pain relief, you may take 1 extra strength Tylenol (acetaminophen) and 2  ibuprofen (200 mg) together every 4 hours as needed. (Do not take these medications if you are allergic to them or if you know you cannot take them for any other reason). Typically you may only need pain medication for 1-3 days.   Comments on the Post-Operative Period Slight swelling and redness often appear around the wound. This is normal and will disappear within several days following the surgery. The healing wound will drain a brownish-red-yellow discharge during healing. This is a normal phase of wound healing. As the wound begins to heal, the drainage may increase in amount. Again, this drainage is normal. Notify us if the drainage becomes persistently bloody, excessively swollen, or intensely painful or develops a foul odor or red streaks.  The healing wound will also typically be itchy. This is normal. If you have severe or persistent pain, Notify us if the discomfort is severe or persistent. Avoid alcoholic beverages when taking pain medicine.  In Case of Wound Hemorrhage A wound hemorrhage is when the bandage suddenly becomes soaked with bright red blood and flows profusely. If this happens, sit down or lie down with your head elevated. If the wound has a dressing on it, do not remove the dressing. Apply pressure to the existing gauze. If the wound is not covered, use a gauze pad to apply pressure and continue applying the pressure for 20 minutes without peeking. DO NOT COVER THE WOUND WITH A LARGE TOWEL OR WASH CLOTH. Release your hand from the  wound site but do not remove the dressing. If the bleeding has stopped, gently clean around the wound. Leave the dressing in place for 24 hours if possible. This wait time allows the blood vessels to close off so that you do not spark a new round of bleeding by disrupting the newly clotted blood vessels with an immediate dressing change. If the bleeding does not subside, continue to hold pressure for 40 minutes. If bleeding continues, page your  physician, contact an After Hours clinic or go to the Emergency Room.    Important Information  Due to recent changes in healthcare laws, you may see results of your pathology and/or laboratory studies on MyChart before the doctors have had a chance to review them. We understand that in some cases there may be results that are confusing or concerning to you. Please understand that not all results are received at the same time and often the doctors may need to interpret multiple results in order to provide you with the best plan of care or course of treatment. Therefore, we ask that you please give [place] 2 business days to thoroughly review all your results before contacting the office for clarification. Should we see a critical lab result, you will be contacted sooner.   If You Need Anything After Your Visit  If you have any questions or concerns for your doctor, please call our main line at [phone] If no one answers, please leave a voicemail as directed and we will return your call as soon as possible. Messages left after 4 pm will be answered the following business day.   You may also send [place] a message via MyChart. We typically respond to MyChart messages within 1-2 business days.  For prescription refills, please ask your pharmacy to contact our office. Our fax number is [phone].  If you have an urgent issue when the clinic is closed that cannot wait until the next business day, you can page your doctor at the number below.    Please note that while we do our best to be available for urgent issues outside of office hours, we are not available 24/7.   If you have an urgent issue and are unable to reach [place], you may choose to seek medical care at your doctor's office, retail clinic, urgent care center, or emergency room.  If you have a medical emergency, please immediately call 911 or go to the emergency department. In the event of inclement weather, please call our main line at  [phone] for an update on the status of any delays or closures.  Dermatology Medication Tips: Please keep the boxes that topical medications come in in order to help keep track of the instructions about where and how to use these. Pharmacies typically print the medication instructions only on the boxes and not directly on the medication tubes.   If your medication is too expensive, please contact our office at [phone] or send [place] a message through MyChart.   We are unable to tell what your co-pay for medications will be in advance as this is different depending on your insurance coverage. However, we may be able to find a substitute medication at lower cost or fill out paperwork to get insurance to cover a needed medication.   If a prior authorization is required to get your medication covered by your insurance company, please allow [place] 1-2 business days to complete this process.  Drug prices often vary depending on where the prescription is filled and  some pharmacies may offer cheaper prices.  The website www.goodrx.com contains coupons for medications through different pharmacies. The prices here do not account for what the cost may be with help from insurance (it may be cheaper with your insurance), but the website can give you the price if you did not use any insurance.  - You can print the associated coupon and take it with your prescription to the pharmacy.  - You may also stop by our office during regular business hours and pick up a GoodRx coupon card.  - If you need your prescription sent electronically to a different pharmacy, notify our office through [facility] or by phone at [phone]

## 2023-04-02 NOTE — Progress Notes (Signed)
Follow-Up Visit   Subjective  Virginia Yu is a 67 y.o. 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 Caralyn Guile.   She also has a spot of concern on her left nasal sidewall, present for several months. Not changing or growing.  The following portions of the chart were reviewed this encounter and updated as appropriate: medications, allergies, medical history  Review of Systems:  No other skin or systemic complaints except as noted in HPI or Assessment and Plan.  Objective  Well appearing patient in no apparent distress; mood and affect are within normal limits.  A focused examination was performed of the following areas:  Left upper lip  Relevant physical exam findings are noted in the Assessment and Plan.     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 No   Was the anticoagulation regimen changed prior to Mohs? No    Procedure Details: Timeout: pre-procedure verification complete Procedure Prep: patient was prepped and draped in usual sterile fashion Prep type: chlorhexidine Biopsy accession number: [id number] Biopsy lab: [facility] Date of biopsy: 01/08/2023 Frozen section biopsy performed: No   Specimen debulked: No   Pre-Op diagnosis: squamous cell carcinoma SCC subtype: in situ MohsAIQ Surgical site (if tumor spans multiple areas, please select predominant area): cutaneous lip Surgery side: left Surgical site (from skin exam): Left Upper Cutaneous Lip Pre-operative length (cm): 0.7 Pre-operative width (cm): 0.8 Indications for Mohs surgery: anatomic location where tissue conservation is critical Previously treated? No    Micrographic Surgery Details: Post-operative length (cm): 1 Post-operative width  (cm): 1 Number of Mohs stages: 1 Is this a complex case (associate members only): No    Stage 1    Tumor features identified on Mohs section: no tumor identified    Depth of defect after stage: subcutaneous fat  Patient tolerance of procedure: tolerated well, no immediate complications  Reconstruction: Was the defect reconstructed? Yes   Was reconstruction performed by the same Mohs surgeon? Yes   Setting of reconstruction: outpatient office When was reconstruction performed? same day Type of reconstruction: linear Linear reconstruction: complex Length of linear repair (cm): 2  Opioids: Did the patient receive a prescription for opioid/narcotic related to Mohs surgery? Yes   Indications for opioid/narcotics: patient required additional pain relief despite trial of non-opioid analgesia  Antibiotics: Does patient meet AHA guidelines for endocarditis?: No   Does patient meet AHA guidelines for orthopedic prophylaxis?: No   Were antibiotics given on the day of surgery?: No   Did surgery breach mucosa, expose cartilage/bone, involve an area of lymphedema/inflamed/infected tissue? No    Skin repair Complexity:  Complex Final length (cm):  1.8 Informed consent: discussed and consent obtained   Timeout: patient name, date of birth, surgical site, and procedure verified   Procedure prep:  Patient was prepped and draped in usual sterile fashion Prep type:  Chlorhexidine Anesthesia: the lesion was anesthetized in a standard fashion   Anesthetic:  1% lidocaine w/ epinephrine 1-100,000 buffered w/ 8.4% NaHCO3 Reason for type of repair: reduce tension to allow closure and preserve normal anatomy   Undermining: edges could be approximated without difficulty   Subcutaneous layers (deep stitches):  Suture size:  5-0 Suture type: Monocryl (poliglecaprone 25)   Stitches:  Buried horizontal mattress Fine/surface layer approximation (  top stitches):  Suture size:  6-0 Suture type:  fast-absorbing plain gut   Stitches: running subcuticular   Suture removal (days):  0 Hemostasis achieved with: suture, pressure and electrodesiccation Outcome: patient tolerated procedure well with no complications   Post-procedure details: sterile dressing applied and wound care instructions given   Dressing type: bandage and pressure dressing   Related Medications mupirocin ointment (BACTROBAN) 2 % Apply 1 Application topically daily. oxycodone (OXY-IR) 5 MG capsule Take 1 capsule (5 mg total) by mouth every 4 (four) hours as needed.   ACTINIC KERATOSIS Exam: Erythematous thin papules/macules with gritty scale on left nasal sidewall  Actinic keratoses are precancerous spots that appear secondary to cumulative UV radiation exposure/sun exposure over time. They are chronic with expected duration over 1 year. A portion of actinic keratoses will progress to squamous cell carcinoma of the skin. It is not possible to reliably predict which spots will progress to skin cancer and so treatment is recommended to prevent development of skin cancer.  Recommend daily broad spectrum sunscreen SPF 30+ to sun-exposed areas, reapply every 2 hours as needed.  Recommend staying in the shade or wearing Kahler sleeves, sun glasses (UVA+UVB protection) and wide brim hats (4-inch brim around the entire circumference of the hat). Call for new or changing lesions.  Treatment Plan:  Prior to procedure, discussed risks of blister formation, small wound, skin dyspigmentation, or rare scar following cryotherapy. Recommend Vaseline ointment to treated areas while healing.  Destruction Procedure Note Destruction method: cryotherapy   Informed consent: discussed and consent obtained   Lesion destroyed using liquid nitrogen: Yes   Outcome: patient tolerated procedure well with no complications   Post-procedure details: wound care instructions given   Locations: left nasal sidewall # of Lesions Treated:  1   Return in about 2 weeks (around 04/16/2023) for mohs f/u.  Dominga Ferry, Surg Tech III, am acting as scribe for Gwenith Daily, MD.    04/02/2023  HISTORY OF PRESENT ILLNESS  Virginia Yu is seen in consultation at the request of Dr. Caralyn Guile for biopsy-proven Squamous Cell Carcinoma in Situ. They note that the area has been present for about 6 months increasing in size with time.  There is no history of previous treatment.  Reports no other new or changing lesions and has no other complaints today.  Medications and allergies: see patient chart.  Review of systems: Reviewed 8 systems and notable for the above skin cancer.  All other systems reviewed are unremarkable/negative, unless noted in the HPI. Past medical history, surgical history, family history, social history were also reviewed and are noted in the chart/questionnaire.    PHYSICAL EXAMINATION  General: Well-appearing, in no acute distress, alert and oriented x 4. Vitals reviewed in chart (if available).   Skin: Exam reveals a 0.7 x 0.8 cm erythematous papule and biopsy scar on the left upper lip. There are rhytids, telangiectasias, and lentigines, consistent with photodamage.  Biopsy report(s) reviewed, confirming the diagnosis.  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 Due to location, size, histology, or recurrence and the likelihood of subclinical extension as well as the need to conserve normal surrounding tissue, the patient was deemed acceptable for Mohs micrographic surgery (MMS).  The nature and purpose of the procedure, associated benefits and risks including recurrence and scarring, possible complications such as pain, infection, and bleeding, and alternative methods of treatment if  appropriate were discussed with the patient during consent. The lesion location was verified by the patient, by reviewing previous notes, pathology reports,  and by photographs as well as angulation measurements if available.  Informed consent was reviewed and signed by the patient, and timeout was performed at 8:45 AM. See op note below.  2. For the photodamage and solar lentigines, sun protection discussed/information given on OTC sunscreens, and we recommend continued regular follow-up with primary dermatologist every 6 months or sooner for any growing, bleeding, or changing lesions. 3. Prognosis and future surveillance discussed. 4. Letter with treatment outcome sent to referring provider. 5. Pain acetaminophen/ibuprofen/oxycodone 5 mg 6. LN- AK on left nasal sidewall  MOHS MICROGRAPHIC SURGERY AND RECONSTRUCTION  Initial size:   0.7 x 0.8 cm Surgical defect/wound size: 1.0  x 1.0 cm Anesthesia:    0.33% lidocaine with 1:200,000 epinephrine EBL:    <5 mL Complications:  None Repair type:   Complex SQ suture:   5-0 Monocryl Cutaneous suture:  6-0 Plain gut Final size of the repair: 1.8 cm  Stages: 1  STAGE I: Anesthesia achieved with 0.5% lidocaine with 1:200,000 epinephrine. ChloraPrep applied. 1 section(s) excised using Mohs technique (this includes total peripheral and deep tissue margin excision and evaluation with frozen sections, excised and interpreted by the same physician). The tumor was first debulked and then excised with an approx. 2mm margin.  Hemostasis was achieved with electrocautery as needed.  The specimen was then oriented, subdivided/relaxed, inked, and processed using Mohs technique.    Frozen section analysis revealed a clear deep and peripheral margin.  Reconstruction  The surgical wound was then cleaned, prepped, and re-anesthetized as above. Wound edges were undermined extensively along at least one entire edge and at a distance equal to or greater than the width of the defect (see wound defect size above) in order to achieve closure and decrease wound tension and anatomic distortion. Redundant tissue repair  including standing cone removal was performed. Hemostasis was achieved with electrocautery. Subcutaneous and epidermal tissues were approximated with the above sutures. The surgical site was then lightly scrubbed with sterile, saline-soaked gauze. The area was then bandaged using Vaseline ointment, non-adherent gauze, gauze pads, and tape to provide an adequate pressure dressing. The patient tolerated the procedure well, was given detailed written and verbal wound care instructions, and was discharged in good condition.   The patient will follow-up: 2 weeks.  - mupirocin ointment (BACTROBAN) 2 %; Apply 1 Application topically daily.  Dispense: 22 g; Refill: 0 - oxycodone (OXY-IR) 5 MG capsule; Take 1 capsule (5 mg total) by mouth every 4 (four) hours as needed.  Dispense: 30 capsule; Refill: 0  Documentation: I have reviewed the above documentation for accuracy and completeness, and I agree with the above.  Gwenith Daily, MD

## 2023-04-06 MED ORDER — OXYCODONE HCL 5 MG PO TABS: 5.0000 mg | ORAL_TABLET | Freq: Four times a day (QID) | ORAL | 0 refills | Status: DC | PRN

## 2023-04-06 NOTE — Addendum Note (Signed)
Addended by: Gwenith Daily on: 04/06/2023 04:54 PM   Modules accepted: Orders

## 2023-04-17 ENCOUNTER — Ambulatory Visit: Payer: Medicare Other | Admitting: Dermatology

## 2023-04-17 ENCOUNTER — Encounter: Payer: Self-pay | Admitting: Dermatology

## 2023-04-17 VITALS — BP 115/71 | HR 85

## 2023-04-17 DIAGNOSIS — L905 Scar conditions and fibrosis of skin: Secondary | ICD-10-CM

## 2023-04-17 DIAGNOSIS — Z85828 Personal history of other malignant neoplasm of skin: Secondary | ICD-10-CM | POA: Diagnosis not present

## 2023-04-17 DIAGNOSIS — T1490XD Injury, unspecified, subsequent encounter: Secondary | ICD-10-CM

## 2023-04-17 DIAGNOSIS — C4492 Squamous cell carcinoma of skin, unspecified: Secondary | ICD-10-CM

## 2023-04-17 NOTE — Patient Instructions (Signed)

## 2023-04-17 NOTE — Progress Notes (Signed)
   Follow-Up Visit   Subjective  Virginia Yu is a 68 y.o. female who presents for the following: Mohs to left upper cutaneous lip  Pt here to follow up on Mohs for SCCIS from 04/02/23 to left cutaneous lip which pt had no issues. Lesion was repaired with linear closure.  The following portions of the chart were reviewed this encounter and updated as appropriate: medications, allergies, medical history  Review of Systems:  No other skin or systemic complaints except as noted in HPI or Assessment and Plan.  Objective  Well appearing patient in no apparent distress; mood and affect are within normal limits.  A focused examination was performed of the following areas: Left upper cutaneous lip  Relevant exam findings are noted in the Assessment and Plan.    Assessment & Plan   Scar s/p Mohs for SCCIS on the left vermilion border, treated on 04/02/2023, repaired with linear closure - Reassured that wound has healed well - Discussed that scars take up to 12 months to mature from the date of surgery - Recommend SPF 30+ to scar daily to prevent purple color - OK to start scar massage at 4-6 weeks post-op - Can consider silicone based products for scar healing - Continue ointment for another week    Return in about 3 months (around 07/16/2023) for wound check.  I, Tillie Fantasia, CMA, am acting as scribe for Gwenith Daily, MD.   Documentation: I have reviewed the above documentation for accuracy and completeness, and I agree with the above.  Gwenith Daily, MD

## 2023-04-23 ENCOUNTER — Ambulatory Visit: Payer: Medicare Other

## 2023-04-30 ENCOUNTER — Ambulatory Visit
Admission: RE | Admit: 2023-04-30 | Discharge: 2023-04-30 | Disposition: A | Discharge status: Home / Self Care | Payer: Medicare Other | Source: Ambulatory Visit | Attending: Adult Health Nurse Practitioner | Admitting: Adult Health Nurse Practitioner

## 2023-04-30 DIAGNOSIS — Z0000 Encounter for general adult medical examination without abnormal findings: Secondary | ICD-10-CM

## 2023-05-11 ENCOUNTER — Encounter: Payer: Self-pay | Admitting: Internal Medicine

## 2023-07-16 ENCOUNTER — Ambulatory Visit: Payer: Medicare Other | Admitting: Dermatology

## 2023-07-20 ENCOUNTER — Ambulatory Visit (AMBULATORY_SURGERY_CENTER): Admitting: *Deleted

## 2023-07-20 ENCOUNTER — Telehealth: Payer: Self-pay | Admitting: *Deleted

## 2023-07-20 VITALS — Ht 64.0 in | Wt 198.0 lb

## 2023-07-20 DIAGNOSIS — Z1211 Encounter for screening for malignant neoplasm of colon: Secondary | ICD-10-CM

## 2023-07-20 MED ORDER — SUTAB 1479-225-188 MG PO TABS: 12.0000 | ORAL_TABLET | ORAL | 0 refills | Status: AC

## 2023-07-20 NOTE — Progress Notes (Addendum)
 Pt's name and DOB verified at the beginning of the pre-visit wit 2 identifiers  Pt denies any difficulty with ambulating,sitting, laying down or rolling side to side  Pt has no issues with ambulation   Pt has no issues moving head neck or swallowing  No egg or soy allergy known to patient   No issues known to pt with past sedation with any surgeries or procedures  Pt denies having issues being intubated  No FH of Malignant Hyperthermia  Pt is not on diet pills or shots  Pt is not on home 02   Pt is not on blood thinners   Pt denies issues with constipation   Pt is not on dialysis  Pt denise any abnormal heart rhythms   Pt denies any upcoming cardiac testing   Chart not reviewed by CRNA prior to PV  Visit by phone  Pt states weight is 198 lb  IInstructions reviewed. Pt given , [facility] main # and MD on call # prior to instructions.  Pt states understanding of instructions. Instructed to review again prior to procedure. Pt states they will.  Pt requests sutab

## 2023-07-20 NOTE — Telephone Encounter (Signed)
 Attempt to reach pt for pre-visit. LM with call back # Will attempt to reach again in 5 min due to no other # listed in profile    Second attempt  reached  pt

## 2023-08-11 ENCOUNTER — Ambulatory Visit: Admitting: Dermatology

## 2023-08-14 ENCOUNTER — Encounter: Admitting: Internal Medicine

## 2023-08-19 ENCOUNTER — Ambulatory Visit: Admitting: Dermatology

## 2023-09-09 ENCOUNTER — Telehealth (INDEPENDENT_AMBULATORY_CARE_PROVIDER_SITE_OTHER): Payer: Self-pay | Admitting: Otolaryngology

## 2023-09-09 NOTE — Telephone Encounter (Signed)
 LVM to confirm appt & location [id number] afm

## 2023-09-10 ENCOUNTER — Ambulatory Visit (INDEPENDENT_AMBULATORY_CARE_PROVIDER_SITE_OTHER): Admitting: Otolaryngology

## 2023-09-10 ENCOUNTER — Encounter (INDEPENDENT_AMBULATORY_CARE_PROVIDER_SITE_OTHER): Payer: Self-pay | Admitting: Otolaryngology

## 2023-09-10 VITALS — BP 152/90 | HR 86

## 2023-09-10 DIAGNOSIS — Z87891 Personal history of nicotine dependence: Secondary | ICD-10-CM

## 2023-09-10 DIAGNOSIS — J342 Deviated nasal septum: Secondary | ICD-10-CM

## 2023-09-10 DIAGNOSIS — K219 Gastro-esophageal reflux disease without esophagitis: Secondary | ICD-10-CM | POA: Diagnosis not present

## 2023-09-10 DIAGNOSIS — R490 Dysphonia: Secondary | ICD-10-CM

## 2023-09-10 DIAGNOSIS — R0981 Nasal congestion: Secondary | ICD-10-CM | POA: Diagnosis not present

## 2023-09-10 DIAGNOSIS — R0982 Postnasal drip: Secondary | ICD-10-CM | POA: Diagnosis not present

## 2023-09-10 DIAGNOSIS — J3089 Other allergic rhinitis: Secondary | ICD-10-CM

## 2023-09-10 DIAGNOSIS — J382 Nodules of vocal cords: Secondary | ICD-10-CM | POA: Diagnosis not present

## 2023-09-10 DIAGNOSIS — J343 Hypertrophy of nasal turbinates: Secondary | ICD-10-CM

## 2023-09-10 MED ORDER — AZELASTINE HCL 0.1 % NA SOLN: 2.0000 | Freq: Two times a day (BID) | NASAL | 12 refills | Status: DC

## 2023-09-10 NOTE — Patient Instructions (Signed)

## 2023-09-10 NOTE — Progress Notes (Signed)
 ENT CONSULT:  Reason for Consult: voice changes after bronchitis and laryngitis 3 mo   HPI: Discussed the use of AI scribe software for clinical note transcription with the patient, who gave verbal consent to proceed.  History of Present Illness Virginia Yu is a 68 year old female who presents with voice changes following bronchitis/laryngitis 3 months ago.  She has experienced changes in her voice since February, following an episode of bronchitis. During the bronchitis, she was unable to talk or whisper. Although her voice returned, it remains abnormal, described as 'rough'. Her voice diminishes or becomes lower after speaking for a while, and she is no longer able to sing at church as she used to.  Her bronchitis was treated with steroids, but she did not receive antibiotics. No pain when swallowing or talking, and no coughing or choking while swallowing. No shortness of breath and no history of lung disease.  She has a history of allergies, for which she takes an over-the-counter allergy medication. She does not use nasal sprays due to epistaxis. She also has reflux, which is well-controlled with Prilosec.  She mentions a history of skin cancer on her lip left upper lip, treated earlier this year with Mohs, and was informed that everything was removed.   Records Reviewed:  Derm office note 04/17/23 Scar s/p Mohs for SCCIS on the left vermilion border, treated on 04/02/2023, repaired with linear closure - Reassured that wound has healed well - Discussed that scars take up to 12 months to mature from the date of surgery - Recommend SPF 30+ to scar daily to prevent purple color - OK to start scar massage at 4-6 weeks post-op - Can consider silicone based products for scar healing - Continue ointment for another week   PCP office visit 06/23/23 Was originally seen on 05/29/23 and treated with tessalon pearls and these did not help/ Was also treated for Thrush with nystatin rinse and this  did not improve her symptoms.  Was seen on 06/07/23 for acute bronchitis and treated with prednisone 20mg  for 5 days, tessalong pearls, mucinex. Cough improved after this and her laryngitis did not resolve.  Today reports: She continues to cough intermittent, but reduced, productive at times, does not not wake her from sleep. Drinking 65-80 ounces of water. No longer taking mucinex.  She was tested for Strep which was negative.  Past Medical History, Past Surgery History, Allergies, Social History, and Family History were reviewed and updated.   Review of Systems  Constitutional: Positive for activity change and fatigue. Negative for appetite change, chills, diaphoresis, fever and unexpected weight change.  Negative except noted above.   Given Medrol pack   Past Medical History:  Diagnosis Date   Arthritis    GERD (gastroesophageal reflux disease)    Hypertension    Hypothyroidism    Squamous cell carcinoma of skin 01/08/2023   SCC in situ - Left upper cutaneous lip - needs LN2    Past Surgical History:  Procedure Laterality Date   COLONOSCOPY     TUBAL LIGATION  04/22/1991    Family History  Problem Relation Age of Onset   Colon polyps Mother    Heart disease Father    Colon cancer Neg Hx    Esophageal cancer Neg Hx    Rectal cancer Neg Hx    Stomach cancer Neg Hx     Social History:  reports that she has quit smoking. She has never used smokeless tobacco. She reports that she does  not drink alcohol and does not use drugs.  Allergies:  Allergies  Allergen Reactions   Amoxicillin Hives    Medications: I have reviewed the patient's current medications.  The PMH, PSH, Medications, Allergies, and SH were reviewed and updated.  ROS: Constitutional: Negative for fever, weight loss and weight gain. Cardiovascular: Negative for chest pain and dyspnea on exertion. Respiratory: Is not experiencing shortness of breath at rest. Gastrointestinal: Negative for nausea and  vomiting. Neurological: Negative for headaches. Psychiatric: The patient is not nervous/anxious  Blood pressure (!) 152/90, pulse 86, SpO2 95%. There is no height or weight on file to calculate BMI.  PHYSICAL EXAM:  Exam: General: Well-developed, well-nourished Communication and Voice: raspy Respiratory Respiratory effort: Equal inspiration and expiration without stridor Cardiovascular Peripheral Vascular: Warm extremities with equal color/perfusion Eyes: No nystagmus with equal extraocular motion bilaterally Neuro/Psych/Balance: Patient oriented to person, place, and time; Appropriate mood and affect; Gait is intact with no imbalance; Cranial nerves I-XII are intact Head and Face Inspection: Normocephalic and atraumatic without mass or lesion Palpation: Facial skeleton intact without bony stepoffs Salivary Glands: No mass or tenderness Facial Strength: Facial motility symmetric and full bilaterally ENT Pinna: External ear intact and fully developed External canal: Canal is patent with intact skin Tympanic Membrane: Clear and mobile External Nose: No scar or anatomic deformity Internal Nose: Septum is S-shaped. No polyp, or purulence. Mucosal edema and erythema present.  Bilateral inferior turbinate hypertrophy.  Lips, Teeth, and gums: Mucosa and teeth intact and viable TMJ: No pain to palpation with full mobility Oral cavity/oropharynx: No erythema or exudate, no lesions present Nasopharynx: No mass or lesion with intact mucosa Hypopharynx: Intact mucosa without pooling of secretions Larynx Glottic: Full true vocal cord mobility without lesion or mass mild thickening along mid VF edges b/l concerning for phonotrauma vs early nodules  Supraglottic: Normal appearing epiglottis and AE folds Interarytenoid Space: Moderate pachydermia&edema Subglottic Space: Patent without lesion or edema Neck Neck and Trachea: Midline trachea without mass or lesion Thyroid: No mass or  nodularity Lymphatics: No lymphadenopathy  Procedure: Preoperative diagnosis: dysphonia   Postoperative diagnosis:   Same + VF nodules   Procedure: Flexible fiberoptic laryngoscopy  Surgeon: Wava Kildow, MD  Anesthesia: Topical lidocaine and Afrin Complications: None Condition is stable throughout exam  Indications and consent:  The patient presents to the clinic with above symptoms. Indirect laryngoscopy view was incomplete. Thus it was recommended that they undergo a flexible fiberoptic laryngoscopy. All of the risks, benefits, and potential complications were reviewed with the patient preoperatively and verbal informed consent was obtained.  Procedure: The patient was seated upright in the clinic. Topical lidocaine and Afrin were applied to the nasal cavity. After adequate anesthesia had occurred, I then proceeded to pass the flexible telescope into the nasal cavity. The nasal cavity was patent without rhinorrhea or polyp. The nasopharynx was also patent without mass or lesion. The base of tongue was visualized and was normal. There were no signs of pooling of secretions in the piriform sinuses. The true vocal folds were mobile bilaterally. There were no signs of glottic or supraglottic mucosal lesion or mass. There was moderate interarytenoid pachydermia and post cricoid edema. The telescope was then slowly withdrawn and the patient tolerated the procedure throughout.   Studies Reviewed: Surg path 01/08/23 FINAL DIAGNOSIS        1. Skin, left zygoma :       LARGE CELL ACANTHOMA        2. Skin, left upper cutaneous  lip :       SQUAMOUS CELL CARCINOMA IN SITU   Assessment/Plan: Encounter Diagnoses  Name Primary?   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 - Prescribed anti-inflammatory antibiotic and steroids (Bactrim + Medrol Pack) - Recommend speech therapy. - Advised on voice hygiene and lifestyle modifications.  Gastroesophageal reflux disease (GERD) GERD well-controlled with Prilosec; reflux may irritate vocal cords. - Continue Prilosec. - Recommend lifestyle and dietary changes. - Provided Reflux Gourmet supplement information for postprandial use   Chronic nasal congestion Post-nasal drainage Allergic rhinitis Seasonal allergies with postnasal drainage; avoids nasal sprays due to epistaxis. - Prescribed azelastine nasal spray. - Recommend nasal irrigation with saline.      Thank you for allowing me to participate in the care of this patient. Please do not hesitate to contact me with any questions or concerns.   Artice Last, MD Otolaryngology [facility] Phone: [phone] Fax: [phone]    09/10/2023, 3:24 PM

## 2023-09-18 ENCOUNTER — Encounter: Admitting: Internal Medicine

## 2023-10-27 ENCOUNTER — Ambulatory Visit

## 2023-11-06 ENCOUNTER — Encounter: Payer: Self-pay | Admitting: Internal Medicine

## 2023-11-23 ENCOUNTER — Encounter: Payer: Self-pay | Admitting: Physician Assistant

## 2023-11-23 ENCOUNTER — Ambulatory Visit: Admitting: Physician Assistant

## 2023-11-23 VITALS — BP 108/73

## 2023-11-23 DIAGNOSIS — L409 Psoriasis, unspecified: Secondary | ICD-10-CM

## 2023-11-23 DIAGNOSIS — Z85828 Personal history of other malignant neoplasm of skin: Secondary | ICD-10-CM | POA: Diagnosis not present

## 2023-11-23 DIAGNOSIS — L570 Actinic keratosis: Secondary | ICD-10-CM | POA: Diagnosis not present

## 2023-11-23 DIAGNOSIS — Z8589 Personal history of malignant neoplasm of other organs and systems: Secondary | ICD-10-CM

## 2023-11-23 MED ORDER — CLOBETASOL PROPIONATE 0.05 % EX OINT: TOPICAL_OINTMENT | CUTANEOUS | 1 refills | Status: AC

## 2023-11-23 NOTE — Patient Instructions (Signed)

## 2023-11-23 NOTE — Progress Notes (Signed)
   Follow-Up Visit   Subjective  Virginia Yu is a 68 y.o. female who presents for the following: Psoriasis on trunk and extremities. She uses clobetasol  but she has so many spots that she almost needs to bathe in it. She also had a spot of her right upper lip that comes and goes. It is gone right now. Has history of SCC and has undergone Mohs on her left upper cutaneous lip by Dr. Corey.    The following portions of the chart were reviewed this encounter and updated as appropriate: medications, allergies, medical history  Review of Systems:  No other skin or systemic complaints except as noted in HPI or Assessment and Plan.  Objective  Well appearing patient in no apparent distress; mood and affect are within normal limits.   A focused examination was performed of the following areas: Face, scalp, chest, back and extremities.   Relevant exam findings are noted in the Assessment and Plan.  Right upper lip Erythematous thin papules/macules with gritty scale.   Assessment & Plan   AK (ACTINIC KERATOSIS) Right upper lip Destruction of lesion - Right upper lip Complexity: simple   Destruction method: cryotherapy   Informed consent: discussed and consent obtained   Timeout:  patient name, date of birth, surgical site, and procedure verified Lesion destroyed using liquid nitrogen: Yes   Region frozen until ice ball extended beyond lesion: Yes   Outcome: patient tolerated procedure well with no complications   Post-procedure details: wound care instructions given     PSORIASIS Exam: Well-demarcated erythematous patches with silvery scale, guttate pink scaly papules. 5% BSA.  She does have some joint aches in her hands and wrists.   Psoriasis is a chronic non-curable, but treatable genetic/hereditary disease that may have other systemic features affecting other organ systems such as joints (Psoriatic Arthritis). It is associated with an increased risk of inflammatory bowel  disease, heart disease, non-alcoholic fatty liver disease, and depression.  Treatments include light and laser treatments; topical medications; and systemic medications including oral and injectables.  Treatment Plan: Discussed treatment options. Discussed joint aches in association with psoriasis. May consider evaluation with rheumatologist in the future.  Continue clobetasol  ointment apply to affected areas twice daily until clear.   HISTORY OF SQUAMOUS CELL CARCINOMA OF THE SKIN - No evidence of recurrence today - No lymphadenopathy - Recommend regular full body skin exams - Recommend daily broad spectrum sunscreen SPF 30+ to sun-exposed areas, reapply every 2 hours as needed.  - Call if any new or changing lesions are noted between office visits      No follow-ups on file.  I, Roseline Hutchinson, CMA, am acting as scribe for Mohmed Farver K, PA-C .   Documentation: I have reviewed the above documentation for accuracy and completeness, and I agree with the above.  Celisse Ciulla K, PA-C

## 2023-12-22 ENCOUNTER — Encounter: Payer: Self-pay | Admitting: Internal Medicine

## 2023-12-22 ENCOUNTER — Ambulatory Visit (AMBULATORY_SURGERY_CENTER)

## 2023-12-22 VITALS — Ht 64.0 in | Wt 195.0 lb

## 2023-12-22 DIAGNOSIS — Z1211 Encounter for screening for malignant neoplasm of colon: Secondary | ICD-10-CM

## 2023-12-22 NOTE — Progress Notes (Signed)
 No egg or soy allergy known to patient  No issues known to pt with past sedation with any surgeries or procedures Patient denies ever being told they had issues or difficulty with intubation  No FH of Malignant Hyperthermia Pt is not on diet pills Pt is not on  home 02  Pt is not on blood thinners  Pt denies issues with constipation  No A fib or A flutter Have any cardiac testing pending-- no  LOA: independent  Prep: sutab    Patient's chart reviewed by Norleen Schillings CNRA prior to previsit and patient appropriate for the [facility].  Previsit completed and red dot placed by patient's name on their procedure day (on provider's schedule).     PV completed with patient. Prep instructions sent via mychart and home address.

## 2023-12-28 NOTE — Progress Notes (Unsigned)
 [facility] History and Physical   Primary Care Physician:  McClanahan, Kyra, NP   Reason for Procedure:    Encounter Diagnosis  Name Primary?   Colon cancer screening Yes     Plan:    Colonoscopy     HPI: Virginia Yu is a 68 y.o. female status post colonoscopy 2015, no polyps or cancer seen but did have sigmoid diverticulosis.  Pediatric colonoscope was used.  Here for a repeat screening examination.   Past Medical History:  Diagnosis Date   Arthritis    GERD (gastroesophageal reflux disease)    Hypertension    Hypothyroidism    Squamous cell carcinoma of skin 01/08/2023   SCC in situ - Left upper cutaneous lip - needs LN2    Past Surgical History:  Procedure Laterality Date   COLONOSCOPY     TUBAL LIGATION  04/22/1991     Current Outpatient Medications  Medication Sig Dispense Refill   azelastine  (ASTELIN ) 0.1 % nasal spray Place 2 sprays into both nostrils 2 (two) times daily. Use in each nostril as directed (Patient not taking: Reported on 12/22/2023) 30 mL 12   chlorpheniramine (CHLOR-TRIMETON) 4 MG tablet Take by mouth.     clobetasol  (TEMOVATE ) 0.05 % external solution Apply to scalp daily as needed (Patient not taking: Reported on 12/22/2023) 50 mL 1   clobetasol  ointment (TEMOVATE ) 0.05 % Apply to affected area twice a day for 2 weeks. Wait 2 weeks before restarting. 60 g 1   Cyanocobalamin (VITAMIN B-12 PO) Take 1 tablet by mouth daily.     diclofenac (VOLTAREN) 75 MG EC tablet Take 75 mg by mouth 2 (two) times daily.     ezetimibe (ZETIA) 10 MG tablet Take 10 mg by mouth daily.     levothyroxine (SYNTHROID, LEVOTHROID) 88 MCG tablet Take 88 mcg by mouth daily before breakfast.     methylcellulose oral powder Take by mouth daily. (Patient not taking: Reported on 12/22/2023)     mupirocin  ointment (BACTROBAN ) 2 % Apply 1 Application topically daily. 22 g 0   omeprazole (PRILOSEC OTC) 20 MG tablet Take 20 mg by mouth daily.     Sodium Sulfate-Mag  Sulfate-KCl (SUTAB ) 1479-225-188 MG TABS Take 12 tablets by mouth as directed. 12 tablet 0   valsartan-hydrochlorothiazide (DIOVAN-HCT) 320-25 MG tablet Take 1 tablet by mouth daily.     No current facility-administered medications for this visit.    Allergies as of 12/29/2023 - Review Complete 12/22/2023  Allergen Reaction Noted   Amoxicillin Hives 04/25/2013    Family History  Problem Relation Age of Onset   Colon polyps Mother    Heart disease Father    Colon cancer Neg Hx    Esophageal cancer Neg Hx    Rectal cancer Neg Hx    Stomach cancer Neg Hx     Social History   Socioeconomic History   Marital status: Married    Spouse name: Not on file   Number of children: Not on file   Years of education: Not on file   Highest education level: Not on file  Occupational History   Not on file  Tobacco Use   Smoking status: Former   Smokeless tobacco: Never  Substance and Sexual Activity   Alcohol use: No   Drug use: No   Sexual activity: Not on file  Other Topics Concern   Not on file  Social History Narrative   Not on file   Social Drivers of Health   Financial  Resource Strain: Low Risk  (05/20/2023)   Received from [facility]   Overall Financial Resource Strain (CARDIA)    Difficulty of Paying Living Expenses: Not hard at all  Food Insecurity: No Food Insecurity (05/20/2023)   Received from [facility]   Hunger Vital Sign    Within the past 12 months, you worried that your food would run out before you got the money to buy more.: Never true    Within the past 12 months, the food you bought just didn't last and you didn't have money to get more.: Never true  Transportation Needs: No Transportation Needs (05/20/2023)   Received from [facility] - Transportation    Lack of Transportation (Medical): No    Lack of Transportation (Non-Medical): No  Physical Activity: Unknown (05/20/2023)   Received from [facility]   Exercise Vital Sign    On  average, how many days per week do you engage in moderate to strenuous exercise (like a brisk walk)?: 0 days    Minutes of Exercise per Session: Not on file  Stress: No Stress Concern Present (05/20/2023)   Received from [facility] - Occupational Stress Questionnaire    Feeling of Stress : Not at all  Social Connections: Socially Integrated (05/20/2023)   Received from [facility]   Social Network    How would you rate your social network (family, work, friends)?: Good participation with social networks  Intimate Partner Violence: Not At Risk (05/20/2023)   Received from [facility]   HITS    Over the last 12 months how often did your partner physically hurt you?: Never    Over the last 12 months how often did your partner insult you or talk down to you?: Never    Over the last 12 months how often did your partner threaten you with physical harm?: Never    Over the last 12 months how often did your partner scream or curse at you?: Never    Review of Systems: Positive for *** All other review of systems negative except as mentioned in the HPI.  Physical Exam: Vital signs There were no vitals taken for this visit.  General:   Alert,  Well-developed, well-nourished, pleasant and cooperative in NAD Lungs:  Clear throughout to auscultation.   Heart:  Regular rate and rhythm; no murmurs, clicks, rubs,  or gallops. Abdomen:  Soft, nontender and nondistended. Normal bowel sounds.   Neuro/Psych:  Alert and cooperative. Normal mood and affect. A and O x 3   @Donyea Gafford  Virginia Commander, MD, [facility] [phone] (pager) 12/28/2023 5:34 PM@

## 2023-12-29 ENCOUNTER — Encounter: Payer: Self-pay | Admitting: Internal Medicine

## 2023-12-29 ENCOUNTER — Ambulatory Visit: Admitting: Internal Medicine

## 2023-12-29 ENCOUNTER — Encounter: Admitting: Internal Medicine

## 2023-12-29 VITALS — BP 141/66 | HR 64 | Temp 97.5°F | Resp 15 | Ht 64.0 in | Wt 195.0 lb

## 2023-12-29 DIAGNOSIS — K648 Other hemorrhoids: Secondary | ICD-10-CM | POA: Diagnosis not present

## 2023-12-29 DIAGNOSIS — K5730 Diverticulosis of large intestine without perforation or abscess without bleeding: Secondary | ICD-10-CM | POA: Diagnosis not present

## 2023-12-29 DIAGNOSIS — Z1211 Encounter for screening for malignant neoplasm of colon: Secondary | ICD-10-CM

## 2023-12-29 MED ORDER — SODIUM CHLORIDE 0.9 % IV SOLN: 500.0000 mL | INTRAVENOUS | Status: DC

## 2023-12-29 NOTE — Patient Instructions (Addendum)
 Please read handouts provided. Continue present medications. Resume previous diet.   YOU HAD AN ENDOSCOPIC PROCEDURE TODAY AT THE [facility]:   Refer to the procedure report that was given to you for any specific questions about what was found during the examination.  If the procedure report does not answer your questions, please call your gastroenterologist to clarify.  If you requested that your care partner not be given the details of your procedure findings, then the procedure report has been included in a sealed envelope for you to review at your convenience later.  YOU SHOULD EXPECT: Some feelings of bloating in the abdomen. Passage of more gas than usual.  Walking can help get rid of the air that was put into your GI tract during the procedure and reduce the bloating. If you had a lower endoscopy (such as a colonoscopy or flexible sigmoidoscopy) you may notice spotting of blood in your stool or on the toilet paper. If you underwent a bowel prep for your procedure, you may not have a normal bowel movement for a few days.  Please Note:  You might notice some irritation and congestion in your nose or some drainage.  This is from the oxygen used during your procedure.  There is no need for concern and it should clear up in a day or so.  SYMPTOMS TO REPORT IMMEDIATELY:  Following lower endoscopy (colonoscopy or flexible sigmoidoscopy):  Excessive amounts of blood in the stool  Significant tenderness or worsening of abdominal pains  Swelling of the abdomen that is new, acute  Fever of 100F or higher.  For urgent or emergent issues, a gastroenterologist can be reached at any hour by calling [phone]. Do not use MyChart messaging for urgent concerns.    DIET:  We do recommend a small meal at first, but then you may proceed to your regular diet.  Drink plenty of fluids but you should avoid alcoholic beverages for 24 hours.  ACTIVITY:  You should plan to take it easy for  the rest of today and you should NOT DRIVE or use heavy machinery until tomorrow (because of the sedation medicines used during the test).    FOLLOW UP: Our staff will call the number listed on your records the next business day following your procedure.  We will call around 7:15- 8:00 am to check on you and address any questions or concerns that you may have regarding the information given to you following your procedure. If we do not reach you, we will leave a message.     If any biopsies were taken you will be contacted by phone or by letter within the next 1-3 weeks.  Please call us  at [phone] if you have not heard about the biopsies in 3 weeks.    SIGNATURES/CONFIDENTIALITY: You and/or your care partner have signed paperwork which will be entered into your electronic medical record.  These signatures attest to the fact that that the information above on your After Visit Summary has been reviewed and is understood.  Full responsibility of the confidentiality of this discharge information lies with you and/or your care-partner.No polyps or cancer were seen.  You do have diverticulosis - thickened muscle rings and pouches in the colon wall. Please read the handout about this condition. Hemorrhoids also seen.  I appreciate the opportunity to care for you. Lupita CHARLENA Commander, MD, [facility]

## 2023-12-29 NOTE — Progress Notes (Signed)
 Pt's states no medical or surgical changes since previsit or office visit.

## 2023-12-29 NOTE — Progress Notes (Signed)
 To PACU via stretcher, sedated, good respiratory effort, VSS.

## 2023-12-29 NOTE — Op Note (Signed)
 [facility] Patient Name: Virginia Yu Procedure Date: 12/29/2023 11:34 AM MRN: [id number] Endoscopist: Lupita FORBES Commander , MD, [id number] Age: 68 Referring MD:  Date of Birth: 07/24/1955 Gender: Female Account #: [id number] Procedure:                Colonoscopy Indications:              Screening for colorectal malignant neoplasm, Last                            colonoscopy: 2015 Medicines:                Monitored Anesthesia Care Procedure:                Pre-Anesthesia Assessment:                           - Prior to the procedure, a History and Physical                            was performed, and patient medications and                            allergies were reviewed. The patient's tolerance of                            previous anesthesia was also reviewed. The risks                            and benefits of the procedure and the sedation                            options and risks were discussed with the patient.                            All questions were answered, and informed consent                            was obtained. Prior Anticoagulants: The patient has                            taken no anticoagulant or antiplatelet agents. ASA                            Grade Assessment: II - A patient with mild systemic                            disease. After reviewing the risks and benefits,                            the patient was deemed in satisfactory condition to                            undergo the procedure.  After obtaining informed consent, the colonoscope                            was passed under direct vision. Throughout the                            procedure, the patient's blood pressure, pulse, and                            oxygen saturations were monitored continuously. The                            Olympus Scope [id number] was introduced through the                            anus and advanced to the the cecum,  identified by                            appendiceal orifice and ileocecal valve. The                            colonoscopy was performed without difficulty. The                            patient tolerated the procedure well. The quality                            of the bowel preparation was good. The ileocecal                            valve, appendiceal orifice, and rectum were                            photographed. The bowel preparation used was SUTAB                             via split dose instruction. Scope In: 11:39:59 AM Scope Out: 11:49:38 AM Scope Withdrawal Time: 0 hours 7 minutes 25 seconds  Total Procedure Duration: 0 hours 9 minutes 39 seconds  Findings:                 The perianal and digital rectal examinations were                            normal.                           Multiple large-mouthed diverticula were found in                            the sigmoid colon.                           Internal hemorrhoids were found.  The exam was otherwise without abnormality on                            direct and retroflexion views. Complications:            No immediate complications. Estimated Blood Loss:     Estimated blood loss: none. Impression:               - Diverticulosis in the sigmoid colon.                           - Internal hemorrhoids.                           - The examination was otherwise normal on direct                            and retroflexion views.                           - No specimens collected. Recommendation:           - Patient has a contact number available for                            emergencies. The signs and symptoms of potential                            delayed complications were discussed with the                            patient. Return to normal activities tomorrow.                            Written discharge instructions were provided to the                            patient.                            - Resume previous diet.                           - Continue present medications.                           - No repeat colonoscopy due to current age (47                            years or older) and the absence of colonic polyps. Lupita FORBES Commander, MD 12/29/2023 11:58:45 AM This report has been signed electronically.

## 2023-12-30 ENCOUNTER — Telehealth: Payer: Self-pay

## 2023-12-30 NOTE — Telephone Encounter (Signed)
Attempted to reach patient for post-procedure f/u call. No answer. Left message for her to please not hesitate to call if she has any questions/concerns regarding her care. 

## 2023-12-30 NOTE — Telephone Encounter (Signed)
 Opened in error

## 2024-02-24 ENCOUNTER — Ambulatory Visit: Admitting: Physician Assistant

## 2024-03-23 ENCOUNTER — Ambulatory Visit: Admitting: Physician Assistant

## 2024-04-29 ENCOUNTER — Other Ambulatory Visit: Payer: Self-pay | Admitting: Adult Health Nurse Practitioner

## 2024-04-29 DIAGNOSIS — Z1231 Encounter for screening mammogram for malignant neoplasm of breast: Secondary | ICD-10-CM

## 2024-05-10 ENCOUNTER — Ambulatory Visit
Admission: RE | Admit: 2024-05-10 | Discharge: 2024-05-10 | Disposition: A | Source: Ambulatory Visit | Attending: Adult Health Nurse Practitioner

## 2024-05-10 DIAGNOSIS — Z1231 Encounter for screening mammogram for malignant neoplasm of breast: Secondary | ICD-10-CM

## 2024-05-31 ENCOUNTER — Ambulatory Visit: Admitting: Physician Assistant
